# Patient Record
Sex: Female | Born: 1935 | Race: Black or African American | Hispanic: No | Marital: Single
Health system: Southern US, Community
[De-identification: ages and names within clinical notes are randomized; demographics above are authoritative.]

## PROBLEM LIST (undated history)

## (undated) DIAGNOSIS — F0390 Unspecified dementia without behavioral disturbance: Secondary | ICD-10-CM

## (undated) DIAGNOSIS — E119 Type 2 diabetes mellitus without complications: Secondary | ICD-10-CM

## (undated) DIAGNOSIS — I10 Essential (primary) hypertension: Secondary | ICD-10-CM

## (undated) DIAGNOSIS — K219 Gastro-esophageal reflux disease without esophagitis: Secondary | ICD-10-CM

## (undated) DIAGNOSIS — N189 Chronic kidney disease, unspecified: Secondary | ICD-10-CM

## (undated) DIAGNOSIS — M1990 Unspecified osteoarthritis, unspecified site: Secondary | ICD-10-CM

## (undated) DIAGNOSIS — R011 Cardiac murmur, unspecified: Secondary | ICD-10-CM

## (undated) HISTORY — PX: COLONOSCOPY: SHX174

## (undated) HISTORY — PX: TUBAL LIGATION: SHX77

---

## 2005-06-26 ENCOUNTER — Emergency Department: Payer: Self-pay | Admitting: Emergency Medicine

## 2007-04-05 ENCOUNTER — Emergency Department: Payer: Self-pay | Admitting: Internal Medicine

## 2007-04-05 ENCOUNTER — Other Ambulatory Visit: Payer: Self-pay

## 2007-09-30 ENCOUNTER — Emergency Department: Payer: Self-pay | Admitting: Emergency Medicine

## 2011-07-07 ENCOUNTER — Emergency Department: Payer: Self-pay | Admitting: *Deleted

## 2013-06-21 ENCOUNTER — Ambulatory Visit: Payer: Self-pay | Admitting: Family Medicine

## 2015-02-01 ENCOUNTER — Ambulatory Visit: Payer: Self-pay | Admitting: Gastroenterology

## 2015-02-07 ENCOUNTER — Emergency Department
Admit: 2015-02-07 | Disposition: A | Discharge status: Home / Self Care | Payer: Self-pay | Admitting: Emergency Medicine

## 2015-02-07 LAB — COMPREHENSIVE METABOLIC PANEL
ALBUMIN: 3.9 g/dL
ALK PHOS: 90 U/L
ANION GAP: 12 (ref 7–16)
BUN: 17 mg/dL
Bilirubin,Total: 0.7 mg/dL
CHLORIDE: 103 mmol/L
Calcium, Total: 8.9 mg/dL
Co2: 24 mmol/L
Creatinine: 1.76 mg/dL — ABNORMAL HIGH
EGFR (Non-African Amer.): 26 — ABNORMAL LOW
GFR CALC AF AMER: 30 — AB
Glucose: 128 mg/dL — ABNORMAL HIGH
Potassium: 3.5 mmol/L
SGOT(AST): 15 U/L
SGPT (ALT): 8 U/L — ABNORMAL LOW
Sodium: 139 mmol/L
TOTAL PROTEIN: 6.6 g/dL

## 2015-02-07 LAB — CBC WITH DIFFERENTIAL/PLATELET
Basophil #: 0 10*3/uL (ref 0.0–0.1)
Basophil %: 0.3 %
EOS ABS: 0 10*3/uL (ref 0.0–0.7)
Eosinophil %: 0.2 %
HCT: 38.7 % (ref 35.0–47.0)
HGB: 12.6 g/dL (ref 12.0–16.0)
Lymphocyte #: 1.6 10*3/uL (ref 1.0–3.6)
Lymphocyte %: 12.9 %
MCH: 26.9 pg (ref 26.0–34.0)
MCHC: 32.7 g/dL (ref 32.0–36.0)
MCV: 82 fL (ref 80–100)
MONO ABS: 0.7 x10 3/mm (ref 0.2–0.9)
Monocyte %: 5.7 %
NEUTROS PCT: 80.9 %
Neutrophil #: 10.1 10*3/uL — ABNORMAL HIGH (ref 1.4–6.5)
PLATELETS: 249 10*3/uL (ref 150–440)
RBC: 4.7 10*6/uL (ref 3.80–5.20)
RDW: 14.3 % (ref 11.5–14.5)
WBC: 12.4 10*3/uL — ABNORMAL HIGH (ref 3.6–11.0)

## 2015-02-07 LAB — LIPASE, BLOOD: Lipase: 20 U/L — ABNORMAL LOW

## 2015-02-07 LAB — URINALYSIS, COMPLETE
Bacteria: NONE SEEN
Bilirubin,UR: NEGATIVE
Blood: NEGATIVE
Glucose,UR: NEGATIVE mg/dL (ref 0–75)
Hyaline Cast: 6
LEUKOCYTE ESTERASE: NEGATIVE
Nitrite: NEGATIVE
PROTEIN: NEGATIVE
Ph: 5 (ref 4.5–8.0)
Specific Gravity: 1.012 (ref 1.003–1.030)
Squamous Epithelial: 1
WBC UR: <1 /HPF (ref 0–5)

## 2015-02-07 LAB — TROPONIN I: Troponin-I: <0.03 ng/mL

## 2015-02-28 LAB — COMPREHENSIVE METABOLIC PANEL
ALT: 8 U/L — AB
Albumin: 3.6 g/dL
Alkaline Phosphatase: 72 U/L
Anion Gap: 13 (ref 7–16)
BILIRUBIN TOTAL: 0.8 mg/dL
BUN: 34 mg/dL — AB
CHLORIDE: 102 mmol/L
CO2: 21 mmol/L — AB
CREATININE: 2.53 mg/dL — AB
Calcium, Total: 8.7 mg/dL — ABNORMAL LOW
EGFR (African American): 20 — ABNORMAL LOW
EGFR (Non-African Amer.): 17 — ABNORMAL LOW
Glucose: 172 mg/dL — ABNORMAL HIGH
POTASSIUM: 3.8 mmol/L
SGOT(AST): 19 U/L
Sodium: 136 mmol/L
TOTAL PROTEIN: 6.9 g/dL

## 2015-02-28 LAB — CBC WITH DIFFERENTIAL/PLATELET
Basophil #: 0.1 10*3/uL (ref 0.0–0.1)
Basophil %: 0.4 %
Eosinophil #: 0.1 10*3/uL (ref 0.0–0.7)
Eosinophil %: 0.5 %
HCT: 32.5 % — ABNORMAL LOW (ref 35.0–47.0)
HGB: 10.5 g/dL — ABNORMAL LOW (ref 12.0–16.0)
LYMPHS ABS: 1.9 10*3/uL (ref 1.0–3.6)
LYMPHS PCT: 10.5 %
MCH: 26.6 pg (ref 26.0–34.0)
MCHC: 32.1 g/dL (ref 32.0–36.0)
MCV: 83 fL (ref 80–100)
MONOS PCT: 8.3 %
Monocyte #: 1.5 x10 3/mm — ABNORMAL HIGH (ref 0.2–0.9)
NEUTROS PCT: 80.3 %
Neutrophil #: 14.7 10*3/uL — ABNORMAL HIGH (ref 1.4–6.5)
PLATELETS: 271 10*3/uL (ref 150–440)
RBC: 3.93 10*6/uL (ref 3.80–5.20)
RDW: 13.8 % (ref 11.5–14.5)
WBC: 18.3 10*3/uL — AB (ref 3.6–11.0)

## 2015-02-28 LAB — LIPASE, BLOOD: LIPASE: 23 U/L

## 2015-02-28 LAB — TROPONIN I: Troponin-I: <0.03 ng/mL

## 2015-03-01 ENCOUNTER — Inpatient Hospital Stay
Admit: 2015-03-01 | Disposition: A | Discharge status: Home / Self Care | Payer: Self-pay | Attending: Internal Medicine | Admitting: Internal Medicine

## 2015-03-01 LAB — URINALYSIS, COMPLETE
BLOOD: NEGATIVE
Bacteria: NONE SEEN
Bilirubin,UR: NEGATIVE
Glucose,UR: NEGATIVE mg/dL (ref 0–75)
KETONE: NEGATIVE
Leukocyte Esterase: NEGATIVE
Nitrite: NEGATIVE
Ph: 5 (ref 4.5–8.0)
Protein: NEGATIVE
SPECIFIC GRAVITY: 1.013 (ref 1.003–1.030)

## 2015-03-02 LAB — CBC WITH DIFFERENTIAL/PLATELET
BASOS PCT: 0.6 %
Basophil #: 0.1 10*3/uL (ref 0.0–0.1)
Eosinophil #: 0.1 10*3/uL (ref 0.0–0.7)
Eosinophil %: 1.1 %
HCT: 29.6 % — ABNORMAL LOW (ref 35.0–47.0)
HGB: 9.7 g/dL — AB (ref 12.0–16.0)
Lymphocyte #: 1.8 10*3/uL (ref 1.0–3.6)
Lymphocyte %: 18.2 %
MCH: 26.5 pg (ref 26.0–34.0)
MCHC: 32.8 g/dL (ref 32.0–36.0)
MCV: 81 fL (ref 80–100)
Monocyte #: 1 x10 3/mm — ABNORMAL HIGH (ref 0.2–0.9)
Monocyte %: 9.5 %
Neutrophil #: 7.1 10*3/uL — ABNORMAL HIGH (ref 1.4–6.5)
Neutrophil %: 70.6 %
Platelet: 279 10*3/uL (ref 150–440)
RBC: 3.66 10*6/uL — AB (ref 3.80–5.20)
RDW: 13.4 % (ref 11.5–14.5)
WBC: 10 10*3/uL (ref 3.6–11.0)

## 2015-03-02 LAB — BASIC METABOLIC PANEL
ANION GAP: 6 — AB (ref 7–16)
BUN: 22 mg/dL — ABNORMAL HIGH
Calcium, Total: 8.2 mg/dL — ABNORMAL LOW
Chloride: 111 mmol/L
Co2: 23 mmol/L
Creatinine: 1.78 mg/dL — ABNORMAL HIGH
EGFR (African American): 30 — ABNORMAL LOW
EGFR (Non-African Amer.): 26 — ABNORMAL LOW
Glucose: 94 mg/dL
POTASSIUM: 3.8 mmol/L
Sodium: 140 mmol/L

## 2015-03-02 LAB — HEMOGLOBIN A1C: Hemoglobin A1C: 6.3 % — ABNORMAL HIGH

## 2015-03-03 LAB — BASIC METABOLIC PANEL
Anion Gap: 8 (ref 7–16)
BUN: 15 mg/dL
CALCIUM: 8.2 mg/dL — AB
CREATININE: 1.68 mg/dL — AB
Chloride: 110 mmol/L
Co2: 22 mmol/L
EGFR (African American): 32 — ABNORMAL LOW
EGFR (Non-African Amer.): 28 — ABNORMAL LOW
Glucose: 114 mg/dL — ABNORMAL HIGH
POTASSIUM: 3.3 mmol/L — AB
Sodium: 140 mmol/L

## 2015-03-11 NOTE — H&P (Signed)
PATIENT NAME:  Sarah Mccormick, Sarah Mccormick MR#:  [id number] DATE OF BIRTH:  1928-10-10  DATE OF ADMISSION:  03/01/2015  REFERRING PHYSICIAN:  Marjean Donna, MD.   PRIMARY CARE PHYSICIAN: Tomasa Hose, MD.    ADMIT DIAGNOSIS:  Acute on chronic kidney failure.   HISTORY OF PRESENT ILLNESS: This is an 81 year old African-American female who presented to the Emergency Department via EMS after family noticed that she had been feeling excessively weak for approximately 3 days. The patient admits that she has had a decreased appetite, but denies nausea, vomiting, or diarrhea. She denies fevers, chest pain, or shortness of breath as well. Notably she had some flank pain on examination by the Emergency Department physician that was located more in the right flank and upper quadrant. Further questioning revealed that the pain is worse after she eats. The patient denies seeing any blood in her stool. Laboratory evaluation revealed that the patient had a steep decline in her renal function which prompted the Emergency Department to call for admission.   REVIEW OF SYSTEMS:   CONSTITUTIONAL:  The patient denies fevers, but admits to generalized weakness.  EYES: Denies blurred vision or inflammation.  HEENT: Denies tinnitus or sore throat.  RESPIRATORY: Denies cough or shortness of breath.  CARDIOVASCULAR: Denies chest pain, palpitations, orthopnea, or paroxysmal nocturnal dyspnea.  GASTROINTESTINAL: Denies nausea, vomiting, diarrhea, or abdominal pain. When asked, however it is easy to elicit abdominal pain from the patient.  GENITOURINARY: Denies dysuria, increased frequency, or hesitancy of urination.  ENDOCRINE: Denies polyuria, polydipsia.  HEMATOLOGIC AND LYMPHATIC: Denies easy bruising or bleeding.  INTEGUMENTARY: Denies rashes or lesions.  MUSCULOSKELETAL: Denies arthralgias or myalgias.  NEUROLOGIC: Denies numbness in her extremities or dysarthria.  PSYCHIATRIC: Denies depression or suicidal ideation.   PAST  MEDICAL HISTORY:,: Chronic kidney disease, hypertension, and diabetes type 2.   PAST SURGICAL HISTORY: C-section.    SOCIAL HISTORY: The patient reports that she lives in a group home (it is unclear if this is a nursing home). She denies drugs or alcohol use. She admits to dipping snuff.    FAMILY HISTORY: The patient cannot provide any specific ailments that seem to run throughout the family.   MEDICATIONS:  1. Altace 10 mg 1 capsule p.o. b.i.d.  2. Hydrochlorothiazide 25 mg 1 tablet p.o. daily.  3. Lovastatin 40 mg 2 tablets p.o. daily.  4. Metformin 500 mg 1 tablet p.o. b.i.d.  5. Omeprazole 20 mg delayed release capsule 1 capsule p.o. daily.  6. Robitussin D 5 mL every 4 hours as needed for cough while awake.    ALLERGIES: No known drug allergies.   PERTINENT LABORATORY RESULTS AND RADIOGRAPHIC FINDINGS: Serum glucose is 172, BUN 34, creatinine 2.53, serum sodium 136, potassium is 3.8, chloride is 102, bicarbonate 21, calcium is 8.7.  Lipase is 23. Serum albumin is 3.6. Alkaline phosphatase 72, AST is 19, ALT is 8. Troponin is negative. White blood cell count is 18.3, hemoglobin 10.5, hematocrit 32.5, platelet count of 271,000, MCV is 83. Urine is negative for ketones, leukocyte esterase, and nitrites. CT of the abdomen and pelvis without contrast shows that there is right lower lobe airspace opacity that may reflect atelectasis or pneumonia, there is also a trace right pleural effusion, there is a nonobstructing stone within each kidney, there is no hydronephrosis. There is a mildly prominent appendix without stranding or evidence of inflammation. There is some interval resolution of thickened bowel loops within the right upper quadrant .    PHYSICAL EXAMINATION:  VITAL  SIGNS: Temperature is 97.9, pulse 106, respirations 20, blood pressure is 165/79, pulse oximetry is 93% on room air.  GENERAL: The patient is alert and oriented x 2. She is mildly confused about circumstances preceding her  arrival to the Emergency Department. She is in no apparent distress.  HEENT: Normocephalic, atraumatic. Pupils equal, round, and react to light and accommodation. Extraocular movements are intact. Mucous membranes are moist.  NECK: Trachea is midline. No adenopathy. Thyroid is not nonpalpable, nontender. CHEST: Symmetric and atraumatic.  CARDIOVASCULAR: Regular rate and rhythm. Normal S1, S2. No rubs, clicks, or murmurs appreciated.  LUNGS: Clear to auscultation bilaterally. Normal effort and excursion.  ABDOMEN: Positive bowel sounds. Soft, nontender, nondistended. No hepatosplenomegaly.  GENITOURINARY: Deferred.  MUSCULOSKELETAL: The patient moves all 4 extremities equally. She has 5 out of 5 strength in her upper extremities and puts forth a poor effort in cooperating with the lower extremity strength exam, however she is able to walk according to family members and has not had any difficulty walking prior to this admission although I have not tested her gait.  EXTREMITIES: No clubbing, cyanosis, or edema.  NEUROLOGIC: Cranial nerves II through XII are grossly intact.  PSYCHIATRIC: Mood is normal. Affect is congruent. The patient has fair insight and judgment into her medical condition.   ASSESSMENT AND PLAN: This is an 81 year old female admitted for acute on chronic kidney failure, as well as sepsis.   1.  Acute on chronic kidney disease. The patient now is in stage IV chronic kidney disease.  I have started intravenous fluid and we will try to avoid nephrotoxic drugs.  2.  Sepsis. The patient meets criteria via tachycardia, tachypnea, and leukocytosis. She has received a dose of Levaquin in the Emergency Department presumably for pneumonia even though the patient currently denies cough. We will continue her antibiotic coverage for healthcare-associated pneumonia at the patient apparently lives in a group setting. 3.  Hypertension. Continue Altace, although I have reduced the dose given her  kidney failure.  4.  Diabetes type 2. I will hold the patient's metformin for now.  I have added sliding scale insulin while she is in the hospital. 5.  Deep vein thrombosis prophylaxis. Heparin.  6.  Gastrointestinal prophylaxis. None as the patient is not currently critically ill.   CODE STATUS: The patient is a full code.   TIME SPENT ON ADMISSION ORDERS AND PATIENT CARE:  Approximately 40 minutes.     ____________________________ Norva Riffle. Marcille Blanco, MD msd:bu D: 03/01/2015 15:15:10 ET T: 03/01/2015 15:41:44 ET JOB#: [id number]  cc: Norva Riffle. Marcille Blanco, MD, <Dictator> Norva Riffle Analayah Brooke MD ELECTRONICALLY SIGNED 03/04/2015 3:32

## 2015-03-11 NOTE — Consult Note (Signed)
PATIENT NAME:  Sarah Mccormick, Sarah Mccormick MR#:  [id number] DATE OF BIRTH:  18-Oct-1928  DATE OF CONSULTATION:  03/01/2015  REFERRING PHYSICIAN:   CONSULTING PHYSICIAN:  Jerrol Banana. Burt Knack, MD  CHIEF COMPLAINT: Abdominal pain.   HISTORY OF PRESENT ILLNESS: This is a patient with abdominal pain for anywhere from 2 days to longer than 3 weeks depending on who gives the version of the history. The patient is a very poor historian, lives in assisted living facility, is accompanied by her son and a daughter apparently was present earlier who gave Dr. Owens Shark a different story. The patient is difficult historian as she does not volunteer information.   History from the patient and from the son is that this has likely been going on for longer than 3 weeks. In fact, 3 weeks ago, she was here with very similar symptoms, if not identical symptoms, possibly with diarrhea at that time with nausea and vomiting and lower abdominal pain and an elevated white blood cell count and a CT suggestive of enteritis. She has a very similar picture today; see below.  Bowel history cannot be obtained. The patient does not answer the question and the son does not know if she is having diarrhea. She has been nauseated, has not vomited this episode. Denies fevers or chills. Unclear if she has had any bowel movements and cannot answer questions concerning melena or hematochezia. She does have some back pain along with this, which she also had 3 weeks ago according to the son.   Unclear if there has been weight loss.   PAST SURGICAL HISTORY: Cesarean section.   PAST MEDICAL HISTORY: The patient denies any medical history, denies myocardial infarction, stroke or known kidney disease. Has never been told that her creatinine has been elevated (see below).   ALLERGIES: None.   MEDICATIONS: None listed. The patient does not answer any questions. Apparently, she has been taken some Cipro since her last visit.   FAMILY HISTORY: Not  obtainable.  REVIEW OF SYSTEMS: Not obtainable except that mentioned in the HPI.  PHYSICAL EXAMINATION: GENERAL: Healthy, comfortable-appearing female patient.  VITAL SIGNS: Temperature of 98.3, pulse of 90, respirations 20, blood pressure 123/60, room air 94% saturation. Pain scale was 10. BMI of 31.  HEENT: Shows no scleral icterus.  NECK: No palpable neck nodes.  CHEST: Clear to auscultation.  CARDIAC: Regular rate and rhythm.  ABDOMEN: Soft, nondistended, nontympanitic. Minimally tender in the lower quadrants bilaterally without peritoneal signs. No percussion, rebound, or guarding.  EXTREMITIES: Show moderate edema.  NEUROLOGIC: Grossly intact.  INTEGUMENT: Shows no jaundice.   LABORATORY, DIAGNOSTIC AND RADIOLOGICAL DATA: CT scan from 03/30 and from 02/28/2015 are personally reviewed. No sign of appendicitis. There is some thickening of the small bowel walls, more prominent on 03/30 than 04/20 and extensive calcifications in the vessels of the abdomen and retroperitoneum,   White blood cell count is 18.3, with an H and H of 10 and 32, and the white blood cell count was elevated at 11 back on 03/30 but she had a normal hemogram.   Electrolytes are within normal limits, but her creatinine and BUN are elevated with a creatinine of 2.5 and a BUN of 34. CO2 is slightly depressed at 21. Creatinine 1.8 on prior admission.   ASSESSMENT AND PLAN: I was asked to see the patient to rule out appendicitis because her appendix was dilated at 7 mm, but there was no sign of stranding around the appendix and in reviewing the patient's chart  and her history with she and her family, it is clear that she has been having symptoms much longer than 2 or 3 days as Dr. Owens Shark was led to believe. She was here 3 weeks ago with nearly identical symptoms and a workup, which was nearly identical as well with elevated white blood cell count and an abnormal CT scan suggestive of possible enteritis. Unclear if she is  having diarrhea at this time. She is a difficult historian as above. With that in mind, I discussed with Dr. Owens Shark the need for follow up. Her creatinine is elevated and considerably different than it was 3 weeks ago. She needs to be admitted to the hospital and hydrated, but I suggested a medical admission and Dr. Marina Gravel and I would be happy to follow the patient while in the hospital, but I doubt that she will require surgery. Family understood this plan as well.    ____________________________ Jerrol Banana. Burt Knack, MD rec:TM D: 03/01/2015 01:25:47 ET T: 03/01/2015 01:51:17 ET JOB#: [id number]  cc: Jerrol Banana. Burt Knack, MD, <Dictator> Florene Glen MD ELECTRONICALLY SIGNED 03/01/2015 7:00

## 2015-03-11 NOTE — Consult Note (Signed)
Brief Consult Note: Diagnosis: abd pain.   Patient was seen by consultant.   Consult note dictated.   Recommend further assessment or treatment.   Discussed with Attending MD.   Comments: No sign of acute appendicitis. Similar if not identical episode and w/u three weeks ago, and symptoms possibly longer than that. Suspect transient ischemia that is consistant with vasc disease of kidneys, coronaries, etc. Discussed with Dr Owens Shark. Will follow with PD while in hospital.  Electronic Signatures: Florene Glen (MD)  (Signed 21-Apr-16 01:18)  Authored: Brief Consult Note   Last Updated: 21-Apr-16 01:18 by Florene Glen (MD)

## 2015-03-11 NOTE — Consult Note (Signed)
PATIENT NAME:  Sarah Mccormick, Sarah Mccormick MR#:  [id number] DATE OF BIRTH:  09-Dec-1927  DATE OF CONSULTATION:  03/02/2015.  CONSULTING PHYSICIAN:  Manya Silvas, MD.  REASON FOR CONSULTATION:  Patient is an 81 year old black female who was admitted to the hospital for renal insufficiency, worsening creatinine, in association with abdominal pain.  I was asked to see her for the abdominal pain.   HISTORY OF PRESENT ILLNESS:  The abdominal pain has been present for several weeks.  It has improved some since she received antibiotics.  She had a CAT scan a few weeks ago showing the presence of thickening of the small bowel in the clinical setting of nausea and vomiting and diarrhea, and it was suggestive of enteritis.  She had a repeat CAT scan done with this admission and it did not show enteritis.   PAST SURGICAL HISTORY:  C-section and hysterectomy.   PAST MEDICAL HISTORY:  No history of heart attacks, strokes, but she has been diabetic for 30 years and has hypertension   ALLERGIES:  No known drug allergies.   The patient is a difficult historian and 90% of the information is obtained from the patient's  daughter who happens to be in the room.   FAMILY HISTORY:  Positive for mother with colon cancer and a sibling with colon cancer. There are actually 24 children in the family.  She has not had a colonoscopy in many years   The daughter said that she is going to have her mother come and live with her and that she was concerned about her abdominal pain and would like possible endoscopic and colonoscopic investigations for the pain and for the history of colon cancer in the family.   The patient's creatinine has gone up significantly in 3-week period of time and BUN has gone up, indicative of likely inadequate fluid intake.  Also, her white count is up to 18.3.  CAT scan of the abdomen and pelvis showed some right lower airspace opacity that may reflect atelectasis or pneumonia.  There was a nonobstructing  stone in each kidney, some interval resolution of the thickened bowel loops seen previously.   MEDICATIONS:  Altace 10 mg 1 capsule b.i.d., hydrochlorothiazide 25 mg 1 tablet daily, lovastatin 40 mg 2 tablets daily, metformin 500 mg 1 tablet b.i.d., omeprazole 20 mg 1 a day, Robitussin p.r.n.   SOCIAL HISTORY:  The patient does not smoke, does not drink.  She does dip snuff.    REVIEW OF SYSTEMS:   MUSCULOSKELETAL:  There is some generalized weakness.  There is some back discomfort from lying in bed a lot.   CARDIOPULMONARY:  She denies any chest pain or skipping irregular heartbeats or shortness of breath.   GASTROINTESTINAL:  No nausea, vomiting, or diarrhea.   GENITOURINARY:  No dysuria.   PHYSICAL EXAMINATION:  GENERAL:  Black female in no acute distress.  VITAL SIGNS:  Temperature 97.7, pulse 92, respirations 18, blood pressure 166/83, O2  saturation 95% on room air.  HEENT:  Sclerae nonicteric.  Conjunctivae negative.  Tongue is negative.  Head is atraumatic. NECK:  No carotid bruits in the neck.  CHEST:  A few crackles at both bases posteriorly.  HEART:  Short 1/6 systolic murmur.  ABDOMEN:  Bowel sounds present.  No hepatosplenomegaly.  No masses.  No bruits.  No significant tenderness on palpation.  EXTREMITIES:  She has compression devices on her legs.   LABORATORY DATA:  Since admission in the evening 2 days ago, her creatinine  has gone from 2.5 to 1.78, BUN from 34 to 22.  Her potassium now is 3.8, bicarbonate 23.  A liver panel 2 days ago was normal, except for SGPT of 8.  White count was 18.3 two days ago and now 10.  Hemoglobin is 9.7, platelet count 279,000.  Urinalysis essentially negative.    Since admission, she has been started on Levaquin 250 mg IV every 48 hours for possible pneumonia.   ASSESSMENT:   1. Possible pneumonia given infiltrate in the right lower lobe on CAT scan.  This could be atelectasis.  The patient did have elevated white count but this is also in  the setting of volume depletion.  I think it would be a good idea to go ahead and get a good PA and lateral of the chest to serve as a baseline and will order this for in the morning.  2. Patient had abdominal discomfort and CAT scan showing enteritis.  It has improved on the CAT scan this admission compared to previous admission.  The daughter would like, if possible, the patient to have an upper endoscopy and a colonoscopy while here.  I explained to her that if she has early pneumonia, that it may not be a good idea to do these procedures at this time.  We will see how she does over the next few days and consider doing the upper for abdominal pain and the colonoscopy for abdominal pain and family history of colon cancer.    I will follow with you.    ____________________________ Manya Silvas, MD rte:kc D: 03/02/2015 19:11:18 ET T: 03/02/2015 19:43:59 ET JOB#: [id number]  cc: Manya Silvas, MD, <Dictator> Manya Silvas MD ELECTRONICALLY SIGNED 03/06/2015 7:55

## 2015-03-11 NOTE — Discharge Summary (Signed)
PATIENT NAME:  Sarah Mccormick, SANGIOVANNI MR#:  [id number] DATE OF BIRTH:  06/06/1928  DATE OF ADMISSION:  03/01/2015 DATE OF DISCHARGE:  03/03/2015  PRIMARY CARE PHYSICIAN: Ngwe A. Clide Deutscher, MD   DISCHARGE DIAGNOSES:  1.  Acute renal failure on chronic kidney disease, stage IV.  2.  Sepsis with healthcare-associated pneumonia.  3.  Abdominal pain.  4.  Hypertension.  5.  Diabetes.   CONDITION: Stable.   CODE STATUS: Full code.   HOME MEDICATIONS: Please refer to the medication reconciliation list.   DIET: Low-sodium, low-fat, low-cholesterol, ADA diet.   ACTIVITY: As tolerated.   FOLLOWUP CARE: With PCP within 1-2 weeks. Follow up with Dr. Vira Agar for possible endoscopy within 1-2 weeks.   REASON FOR ADMISSION: Acute on chronic weakness for 3 days and poor oral intake.   HOSPITAL COURSE: The patient is an 81 year old African American female with history of CKD, hypertension, and diabetes, was sent from assisted living to ED due to generalized weakness and poor oral intake. For detailed history and physical examination, please refer to the admission note dictated by Dr. Marcille Blanco. Laboratory data on admission date showed BUN 34, creatinine 2.53. Electrolytes were normal. CT of abdomen and pelvis shows right lower lobe airspace opacity that may reflect atelectasis or pneumonia with trace right pleural effusions.  1.  Acute renal failure on CKD. After admission, the patient's ramipril and metformin were on hold due to renal failure. In addition, the patient has been treated with IV fluid support. BUN and creatinine have been improving. Today's BUN is a 15, creatinine is 1.86.  2.  Sepsis with healthcare-associated pneumonia. The patient was initially treated with vancomycin and Zosyn, changed to p.o. Levaquin. The patient only has a cough with sputum but no shortness of breath. The patient's cough is better.  3.  Hypertension. The patient HCTZ and ramipril were on hold due to renal failure. I started  Lopressor. The patient's blood pressure has been controlled. The patient will be discharged home with Altace and Lopressor, but we will discontinue HCTZ due to dehydration and hypokalemia.  4.  Hypokalemia. The patient was treated with potassium supplement.  5.  Diabetes. The patient is on sliding scale. We will resume metformin, since the patient's renal function is better.  6.  For abdominal pain. Dr. Marina Gravel evaluated the patient and suggested no indication for any surgery. The patient was scheduled to have EGD as outpatient. The patient's daughter requested a GI consult. Dr. Vira Agar suggested since the patient had pneumonia at this time, the patient needs to follow up with him for endoscopy as outpatient.   DISPOSITION: The patient's vital signs are stable.  She is clinically stable and will be discharged to assisted living today.   I discussed the patient's discharge plan with the patient, nurse, social worker, and Dr. Vira Agar.   TIME SPENT: About 66.    ____________________________ Demetrios Loll, MD qc:bm D: 03/03/2015 15:29:23 ET T: 03/04/2015 01:40:23 ET JOB#: [id number]  cc: Demetrios Loll, MD, <Dictator> Demetrios Loll MD ELECTRONICALLY SIGNED 03/04/2015 14:27

## 2015-03-30 ENCOUNTER — Ambulatory Visit: Admission: RE | Admit: 2015-03-30 | Payer: Medicare Other | Source: Ambulatory Visit | Admitting: Gastroenterology

## 2015-03-30 ENCOUNTER — Encounter: Admission: RE | Payer: Self-pay | Source: Ambulatory Visit

## 2015-03-30 SURGERY — EGD (ESOPHAGOGASTRODUODENOSCOPY)
Anesthesia: General

## 2015-06-08 ENCOUNTER — Encounter: Payer: Self-pay | Admitting: *Deleted

## 2015-06-11 ENCOUNTER — Ambulatory Visit: Payer: Medicare Other | Admitting: Anesthesiology

## 2015-06-11 ENCOUNTER — Encounter
Admission: RE | Disposition: A | Discharge status: Home / Self Care | Payer: Self-pay | Source: Ambulatory Visit | Attending: Unknown Physician Specialty

## 2015-06-11 ENCOUNTER — Ambulatory Visit
Admission: RE | Admit: 2015-06-11 | Discharge: 2015-06-11 | Disposition: A | Discharge status: Home / Self Care | Payer: Medicare Other | Source: Ambulatory Visit | Attending: Unknown Physician Specialty | Admitting: Unknown Physician Specialty

## 2015-06-11 DIAGNOSIS — M1990 Unspecified osteoarthritis, unspecified site: Secondary | ICD-10-CM | POA: Diagnosis not present

## 2015-06-11 DIAGNOSIS — N189 Chronic kidney disease, unspecified: Secondary | ICD-10-CM | POA: Insufficient documentation

## 2015-06-11 DIAGNOSIS — Z7982 Long term (current) use of aspirin: Secondary | ICD-10-CM | POA: Diagnosis not present

## 2015-06-11 DIAGNOSIS — D122 Benign neoplasm of ascending colon: Secondary | ICD-10-CM | POA: Insufficient documentation

## 2015-06-11 DIAGNOSIS — K640 First degree hemorrhoids: Secondary | ICD-10-CM | POA: Diagnosis not present

## 2015-06-11 DIAGNOSIS — K219 Gastro-esophageal reflux disease without esophagitis: Secondary | ICD-10-CM | POA: Diagnosis not present

## 2015-06-11 DIAGNOSIS — Z79899 Other long term (current) drug therapy: Secondary | ICD-10-CM | POA: Insufficient documentation

## 2015-06-11 DIAGNOSIS — K5730 Diverticulosis of large intestine without perforation or abscess without bleeding: Secondary | ICD-10-CM | POA: Diagnosis not present

## 2015-06-11 DIAGNOSIS — R195 Other fecal abnormalities: Secondary | ICD-10-CM | POA: Diagnosis present

## 2015-06-11 DIAGNOSIS — F0390 Unspecified dementia without behavioral disturbance: Secondary | ICD-10-CM | POA: Diagnosis not present

## 2015-06-11 DIAGNOSIS — E119 Type 2 diabetes mellitus without complications: Secondary | ICD-10-CM | POA: Diagnosis not present

## 2015-06-11 DIAGNOSIS — R011 Cardiac murmur, unspecified: Secondary | ICD-10-CM | POA: Insufficient documentation

## 2015-06-11 DIAGNOSIS — I129 Hypertensive chronic kidney disease with stage 1 through stage 4 chronic kidney disease, or unspecified chronic kidney disease: Secondary | ICD-10-CM | POA: Insufficient documentation

## 2015-06-11 HISTORY — DX: Type 2 diabetes mellitus without complications: E11.9

## 2015-06-11 HISTORY — PX: COLONOSCOPY WITH PROPOFOL: SHX5780

## 2015-06-11 HISTORY — PX: ESOPHAGOGASTRODUODENOSCOPY (EGD) WITH PROPOFOL: SHX5813

## 2015-06-11 HISTORY — DX: Cardiac murmur, unspecified: R01.1

## 2015-06-11 HISTORY — DX: Gastro-esophageal reflux disease without esophagitis: K21.9

## 2015-06-11 HISTORY — DX: Unspecified dementia, unspecified severity, without behavioral disturbance, psychotic disturbance, mood disturbance, and anxiety: F03.90

## 2015-06-11 HISTORY — DX: Chronic kidney disease, unspecified: N18.9

## 2015-06-11 HISTORY — DX: Essential (primary) hypertension: I10

## 2015-06-11 HISTORY — DX: Unspecified osteoarthritis, unspecified site: M19.90

## 2015-06-11 LAB — GLUCOSE, CAPILLARY: GLUCOSE-CAPILLARY: 97 mg/dL (ref 65–99)

## 2015-06-11 SURGERY — COLONOSCOPY WITH PROPOFOL
Anesthesia: General

## 2015-06-11 MED ORDER — FENTANYL CITRATE (PF) 100 MCG/2ML IJ SOLN
INTRAMUSCULAR | Status: DC | PRN
  Administered 2015-06-11: 50 ug via INTRAVENOUS

## 2015-06-11 MED ORDER — MIDAZOLAM HCL 2 MG/2ML IJ SOLN
INTRAMUSCULAR | Status: DC | PRN
  Administered 2015-06-11: 1 mg via INTRAVENOUS

## 2015-06-11 MED ORDER — LIDOCAINE HCL (CARDIAC) 20 MG/ML IV SOLN
INTRAVENOUS | Status: DC | PRN
  Administered 2015-06-11: 40 mg via INTRAVENOUS

## 2015-06-11 MED ORDER — SODIUM CHLORIDE 0.9 % IV SOLN
INTRAVENOUS | Status: DC
  Administered 2015-06-11: 12:00:00 via INTRAVENOUS

## 2015-06-11 MED ORDER — PROPOFOL INFUSION 10 MG/ML OPTIME
INTRAVENOUS | Status: DC | PRN
  Administered 2015-06-11: 120 ug/kg/min via INTRAVENOUS

## 2015-06-11 MED ORDER — LIDOCAINE HCL (PF) 1 % IJ SOLN
INTRAMUSCULAR | Status: AC
  Filled 2015-06-11: qty 2

## 2015-06-11 MED ORDER — GLYCOPYRROLATE 0.2 MG/ML IJ SOLN
INTRAMUSCULAR | Status: DC | PRN
  Administered 2015-06-11: 0.1 mg via INTRAVENOUS

## 2015-06-11 NOTE — Transfer of Care (Signed)
Immediate Anesthesia Transfer of Care Note  Patient: Sarah Mccormick  Procedure(s) Performed: Procedure(s): COLONOSCOPY WITH PROPOFOL (N/A) ESOPHAGOGASTRODUODENOSCOPY (EGD) WITH PROPOFOL (N/A)  Patient Location: PACU  Anesthesia Type:General  Level of Consciousness: awake and sedated  Airway & Oxygen Therapy: Patient Spontanous Breathing and Patient connected to nasal cannula oxygen  Post-op Assessment: Report given to RN and Post -op Vital signs reviewed and stable  Post vital signs: Reviewed and stable  Last Vitals:  Filed Vitals:   06/11/15 1056  BP: 142/39  Pulse: 44  Temp: 36.6 C  Resp: 17    Complications: No apparent anesthesia complications

## 2015-06-11 NOTE — H&P (Signed)
Primary Care Physician:  Donnie Coffin, MD Primary Gastroenterologist:  Dr. Vira Agar  Pre-Procedure History & Physical: HPI:  Sarah Mccormick is a 81 y.o. female is here for an endoscopy and colonoscopy.   Past Medical History  Diagnosis Date  . Murmur, cardiac   . Hypertension   . GERD (gastroesophageal reflux disease)   . Diabetes mellitus without complication   . Chronic kidney disease   . Arthritis   . Dementia     Past Surgical History  Procedure Laterality Date  . Tubal ligation    . Colonoscopy      Prior to Admission medications   Medication Sig Start Date End Date Taking? Authorizing Provider  acetaminophen (TYLENOL) 650 MG CR tablet Take 650 mg by mouth every 8 (eight) hours as needed for pain.   Yes Historical Provider, MD  aspirin EC 81 MG tablet Take 81 mg by mouth daily.   Yes Historical Provider, MD  guaiFENesin (ROBITUSSIN) 100 MG/5ML liquid Take 200 mg by mouth 3 (three) times daily as needed for cough.   Yes Historical Provider, MD  lovastatin (MEVACOR) 40 MG tablet Take 40 mg by mouth at bedtime.   Yes Historical Provider, MD  metFORMIN (GLUCOPHAGE) 500 MG tablet Take by mouth 2 (two) times daily with a meal.   Yes Historical Provider, MD  metoprolol succinate (TOPROL-XL) 25 MG 24 hr tablet Take 25 mg by mouth daily.   Yes Historical Provider, MD  metoprolol tartrate (LOPRESSOR) 25 MG tablet Take 25 mg by mouth 2 (two) times daily.   Yes Historical Provider, MD  omeprazole (PRILOSEC) 20 MG capsule Take 20 mg by mouth daily.   Yes Historical Provider, MD  traZODone (DESYREL) 100 MG tablet Take 100 mg by mouth at bedtime.   Yes Historical Provider, MD  levofloxacin (LEVAQUIN) 250 MG tablet Take 250 mg by mouth daily.    Historical Provider, MD  ramipril (ALTACE) 10 MG capsule Take 10 mg by mouth daily.    Historical Provider, MD    Allergies as of 05/17/2015  . (Not on File)    No family history on file.  History   Social History  . Marital Status:  Single    Spouse Name: N/A  . Number of Children: N/A  . Years of Education: N/A   Occupational History  . Not on file.   Social History Main Topics  . Smoking status: Not on file  . Smokeless tobacco: Not on file  . Alcohol Use: Not on file  . Drug Use: Not on file  . Sexual Activity: Not on file   Other Topics Concern  . Not on file   Social History Narrative  . No narrative on file    Review of Systems: See HPI, otherwise negative ROS  Physical Exam: BP 142/39 mmHg  Pulse 44  Temp(Src) 97.9 F (36.6 C) (Tympanic)  Resp 17  Ht 5\' 1"  (1.549 m)  Wt 82.101 kg (181 lb)  BMI 34.22 kg/m2  SpO2 100% General:   Alert,  pleasant and cooperative in NAD Head:  Normocephalic and atraumatic. Neck:  Supple; no masses or thyromegaly. Lungs:  Clear throughout to auscultation.    Heart:  Regular rate and rhythm. Abdomen:  Soft, nontender and nondistended. Normal bowel sounds, without guarding, and without rebound.   Neurologic:  Alert and  oriented x4;  grossly normal neurologically.  Impression/Plan: Sarah Mccormick is here for an endoscopy and colonoscopy to be performed for heme positive stool  Risks, benefits, limitations, and alternatives regarding  endoscopy and colonoscopy have been reviewed with the patient.  Questions have been answered.  All parties agreeable.   Gaylyn Cheers, MD  06/11/2015, 11:27 AM   Primary Care Physician:  Donnie Coffin, MD Primary Gastroenterologist:  Dr. Vira Agar  Pre-Procedure History & Physical: HPI:  Sarah Mccormick is a 81 y.o. female is here for an endoscopy and colonoscopy.   Past Medical History  Diagnosis Date  . Murmur, cardiac   . Hypertension   . GERD (gastroesophageal reflux disease)   . Diabetes mellitus without complication   . Chronic kidney disease   . Arthritis   . Dementia     Past Surgical History  Procedure Laterality Date  . Tubal ligation    . Colonoscopy      Prior to Admission medications    Medication Sig Start Date End Date Taking? Authorizing Provider  acetaminophen (TYLENOL) 650 MG CR tablet Take 650 mg by mouth every 8 (eight) hours as needed for pain.   Yes Historical Provider, MD  aspirin EC 81 MG tablet Take 81 mg by mouth daily.   Yes Historical Provider, MD  guaiFENesin (ROBITUSSIN) 100 MG/5ML liquid Take 200 mg by mouth 3 (three) times daily as needed for cough.   Yes Historical Provider, MD  lovastatin (MEVACOR) 40 MG tablet Take 40 mg by mouth at bedtime.   Yes Historical Provider, MD  metFORMIN (GLUCOPHAGE) 500 MG tablet Take by mouth 2 (two) times daily with a meal.   Yes Historical Provider, MD  metoprolol succinate (TOPROL-XL) 25 MG 24 hr tablet Take 25 mg by mouth daily.   Yes Historical Provider, MD  metoprolol tartrate (LOPRESSOR) 25 MG tablet Take 25 mg by mouth 2 (two) times daily.   Yes Historical Provider, MD  omeprazole (PRILOSEC) 20 MG capsule Take 20 mg by mouth daily.   Yes Historical Provider, MD  traZODone (DESYREL) 100 MG tablet Take 100 mg by mouth at bedtime.   Yes Historical Provider, MD  levofloxacin (LEVAQUIN) 250 MG tablet Take 250 mg by mouth daily.    Historical Provider, MD  ramipril (ALTACE) 10 MG capsule Take 10 mg by mouth daily.    Historical Provider, MD    Allergies as of 05/17/2015  . (Not on File)    No family history on file.  History   Social History  . Marital Status: Single    Spouse Name: N/A  . Number of Children: N/A  . Years of Education: N/A   Occupational History  . Not on file.   Social History Main Topics  . Smoking status: Not on file  . Smokeless tobacco: Not on file  . Alcohol Use: Not on file  . Drug Use: Not on file  . Sexual Activity: Not on file   Other Topics Concern  . Not on file   Social History Narrative  . No narrative on file    Review of Systems: See HPI, otherwise negative ROS  Physical Exam: BP 142/39 mmHg  Pulse 44  Temp(Src) 97.9 F (36.6 C) (Tympanic)  Resp 17  Ht 5\' 1"   (1.549 m)  Wt 82.101 kg (181 lb)  BMI 34.22 kg/m2  SpO2 100% General:   Alert,  pleasant and cooperative in NAD Head:  Normocephalic and atraumatic. Neck:  Supple; no masses or thyromegaly. Lungs:  Clear throughout to auscultation.    Heart:  Regular rate and rhythm. Abdomen:  Soft, nontender and nondistended. Normal bowel sounds, without guarding,  and without rebound.   Neurologic:  Alert and  oriented x4;  grossly normal neurologically.  Impression/Plan: Sarah Mccormick is here for an endoscopy and colonoscopy to be performed for heme positive stool  Risks, benefits, limitations, and alternatives regarding  endoscopy and colonoscopy have been reviewed with the patient.  Questions have been answered.  All parties agreeable.   Gaylyn Cheers, MD  06/11/2015, 11:27 AM

## 2015-06-11 NOTE — Anesthesia Postprocedure Evaluation (Signed)
  Anesthesia Post-op Note  Patient: Sarah Mccormick  Procedure(s) Performed: Procedure(s): COLONOSCOPY WITH PROPOFOL (N/A) ESOPHAGOGASTRODUODENOSCOPY (EGD) WITH PROPOFOL (N/A)  Anesthesia type:General  Patient location: PACU  Post pain: Pain level controlled  Post assessment: Post-op Vital signs reviewed, Patient's Cardiovascular Status Stable, Respiratory Function Stable, Patent Airway and No signs of Nausea or vomiting  Post vital signs: Reviewed and stable  Last Vitals:  Filed Vitals:   06/11/15 1225  BP: 135/48  Pulse: 63  Temp:   Resp: 14    Level of consciousness: awake, alert  and patient cooperative  Complications: No apparent anesthesia complications

## 2015-06-11 NOTE — Op Note (Signed)
[facility] Gastroenterology Patient Name: Sarah Mccormick Procedure Date: 06/11/2015 11:11 AM MRN: [id number] Account #: [id number] Date of Birth: [birth year] Admit Type: Outpatient Age: 81 Room: [facility] ENDO ROOM 1 Gender: Female Note Status: Finalized Procedure:         Colonoscopy Indications:       Heme positive stool Providers:         Manya Silvas, MD Medicines:         Propofol per Anesthesia Complications:     No immediate complications. Procedure:         Pre-Anesthesia Assessment:                    - After reviewing the risks and benefits, the patient was                     deemed in satisfactory condition to undergo the procedure.                    After obtaining informed consent, the colonoscope was                     passed under direct vision. Throughout the procedure, the                     patient's blood pressure, pulse, and oxygen saturations                     were monitored continuously. The Colonoscope was                     introduced through the anus and advanced to the the cecum,                     identified by appendiceal orifice and ileocecal valve. The                     colonoscopy was performed without difficulty. The patient                     tolerated the procedure well. The quality of the bowel                     preparation was good. Findings:      A diminutive polyp was found in the proximal ascending colon. The polyp       was sessile. The polyp was removed with a jumbo cold forceps. Resection       and retrieval were complete.      A few small-mouthed diverticula were found in the sigmoid colon.      Internal hemorrhoids were found during endoscopy. The hemorrhoids were       medium-sized and Grade I (internal hemorrhoids that do not prolapse).      The exam was otherwise without abnormality. Impression:        - One diminutive polyp in the proximal ascending colon.                     Resected and retrieved.                - Diverticulosis in the sigmoid colon.                    - Internal hemorrhoids.                    -  The examination was otherwise normal. Recommendation:    - The findings and recommendations were discussed with the                     patient's family. Manya Silvas, MD 06/11/2015 12:02:17 PM This report has been signed electronically. Number of Addenda: 0 Note Initiated On: 06/11/2015 11:11 AM Scope Withdrawal Time: 0 hours 7 minutes 2 seconds  Total Procedure Duration: 0 hours 9 minutes 48 seconds       [facility]

## 2015-06-11 NOTE — Anesthesia Preprocedure Evaluation (Signed)
Anesthesia Evaluation  Patient identified by MRN, date of birth, ID band Patient awake    Reviewed: Allergy & Precautions, NPO status , Patient's Chart, lab work & pertinent test results  History of Anesthesia Complications Negative for: history of anesthetic complications  Airway Mallampati: III  TM Distance: >3 FB Neck ROM: Full    Dental  (+) Poor Dentition   Pulmonary          Cardiovascular hypertension, Pt. on medications and Pt. on home beta blockers + Valvular Problems/Murmurs (no tx)     Neuro/Psych    GI/Hepatic GERD-  Medicated and Controlled,  Endo/Other  diabetes, Type 2, Oral Hypoglycemic Agents  Renal/GU Renal InsufficiencyRenal disease     Musculoskeletal   Abdominal   Peds  Hematology   Anesthesia Other Findings   Reproductive/Obstetrics                             Anesthesia Physical Anesthesia Plan  ASA: III  Anesthesia Plan: General   Post-op Pain Management:    Induction: Intravenous  Airway Management Planned: Nasal Cannula  Additional Equipment:   Intra-op Plan:   Post-operative Plan:   Informed Consent: I have reviewed the patients History and Physical, chart, labs and discussed the procedure including the risks, benefits and alternatives for the proposed anesthesia with the patient or authorized representative who has indicated his/her understanding and acceptance.     Plan Discussed with:   Anesthesia Plan Comments:         Anesthesia Quick Evaluation

## 2015-06-11 NOTE — Op Note (Signed)
[facility] Gastroenterology Patient Name: Sarah Mccormick Procedure Date: 06/11/2015 11:12 AM MRN: [id number] Account #: [id number] Date of Birth: 1928/03/16 Admit Type: Outpatient Age: 81 Room: [facility] ENDO ROOM 1 Gender: Female Note Status: Finalized Procedure:         Upper GI endoscopy Indications:       Heme positive stool Providers:         Manya Silvas, MD Referring MD:      Ngwe A. Aycock (Referring MD) Medicines:         Propofol per Anesthesia Complications:     No immediate complications. Procedure:         Pre-Anesthesia Assessment:                    - After reviewing the risks and benefits, the patient was                     deemed in satisfactory condition to undergo the procedure.                    After obtaining informed consent, the endoscope was passed                     under direct vision. Throughout the procedure, the                     patient's blood pressure, pulse, and oxygen saturations                     were monitored continuously. The Endoscope was introduced                     through the mouth, and advanced to the second part of                     duodenum. The upper GI endoscopy was accomplished without                     difficulty. The patient tolerated the procedure well. Findings:      The examined esophagus was normal. GEJ 38cm.      The stomach was normal.      The examined duodenum was normal. Impression:        - Normal esophagus.                    - Normal stomach.                    - Normal examined duodenum.                    - No specimens collected. Recommendation:    - Perform a colonoscopy as previously scheduled. Manya Silvas, MD 06/11/2015 11:47:53 AM This report has been signed electronically. Number of Addenda: 0 Note Initiated On: 06/11/2015 11:12 AM      [facility]

## 2015-06-11 NOTE — Anesthesia Procedure Notes (Signed)
Performed by: COOK-MARTIN, Solace Manwarren Pre-anesthesia Checklist: Patient identified, Emergency Drugs available, Suction available, Patient being monitored and Timeout performed Patient Re-evaluated:Patient Re-evaluated prior to inductionOxygen Delivery Method: Nasal cannula Preoxygenation: Pre-oxygenation with 100% oxygen Intubation Type: IV induction Airway Equipment and Method: Bite block Placement Confirmation: positive ETCO2 and CO2 detector     

## 2015-06-12 LAB — SURGICAL PATHOLOGY

## 2015-06-14 ENCOUNTER — Encounter: Payer: Self-pay | Admitting: Unknown Physician Specialty

## 2016-01-20 IMAGING — CT CT ABD-PELV W/O CM
2 of 4 series · 16 of 46 positions shown, 18 images · non-contrast
Comparison: None.

CLINICAL DATA: Vomiting and mid abdominal pain for 2 days.
Intermittent mid abdominal pain for 2 months.

EXAM:
CT ABDOMEN AND PELVIS WITHOUT CONTRAST
TECHNIQUE: Multidetector CT imaging of the abdomen and pelvis was performed
following the standard protocol without IV contrast.

[Series 2: routine abd pel without · axial · non-contrast · 0.77mm/px · z∈[-387,+18]mm · 13 of 89 slices shown, 15 images]
[im 4/89  soft-tissue]
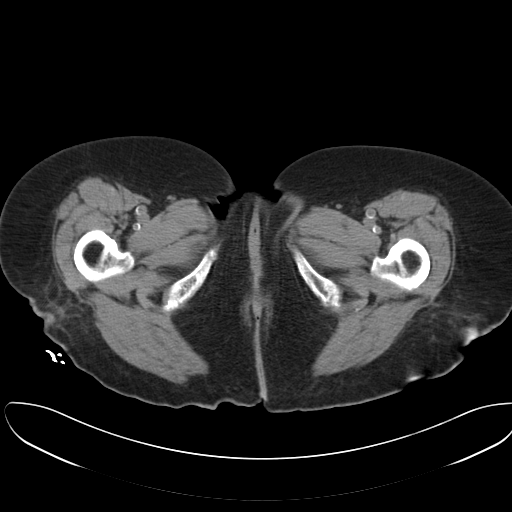
[im 4/89  bone]
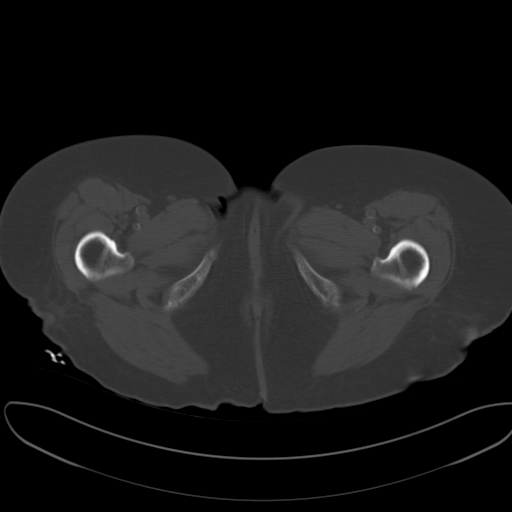
[im 11/89  soft-tissue]
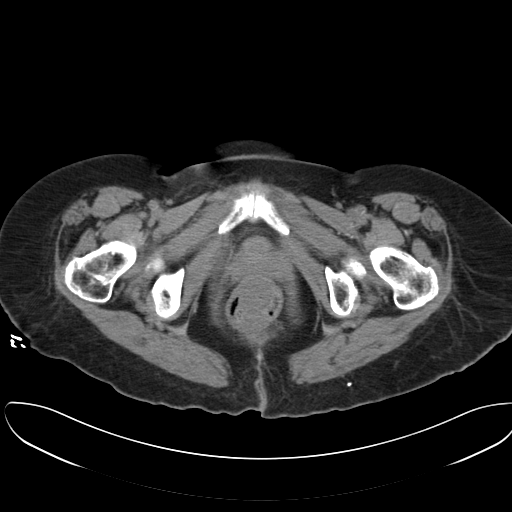
[im 18/89  soft-tissue]
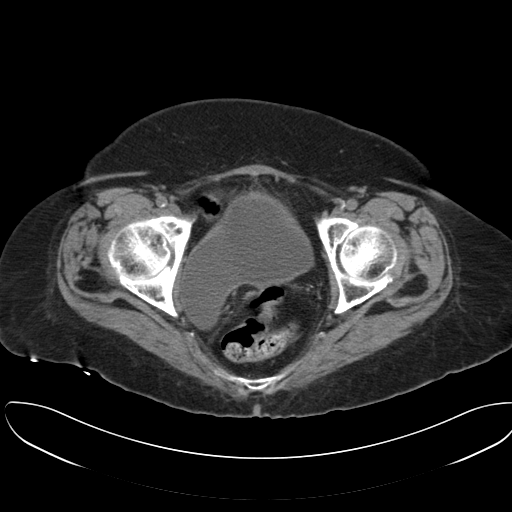
[im 25/89  soft-tissue]
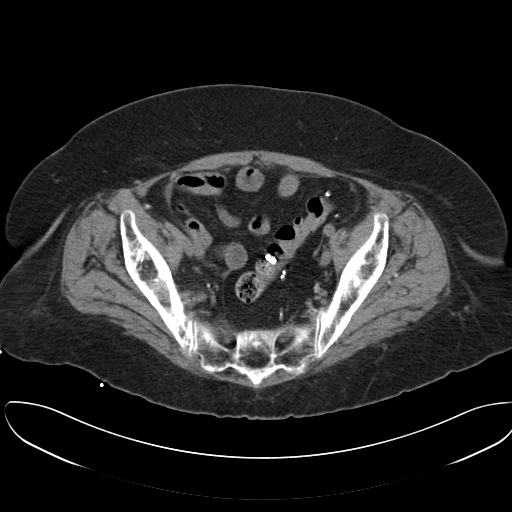
[im 32/89  soft-tissue]
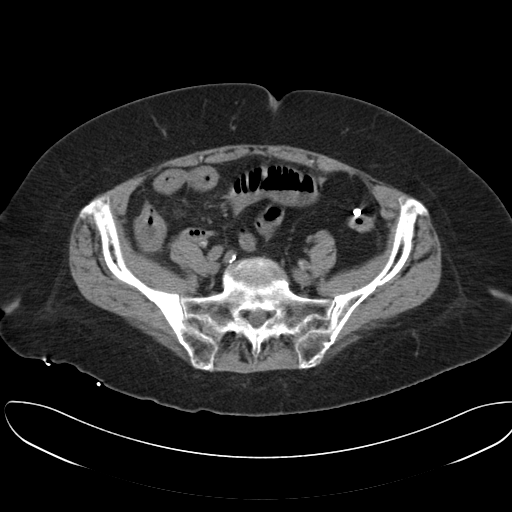
[im 39/89  soft-tissue]
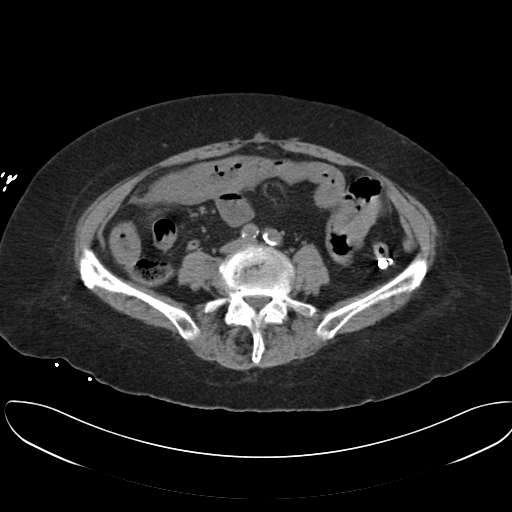
[im 46/89  soft-tissue]
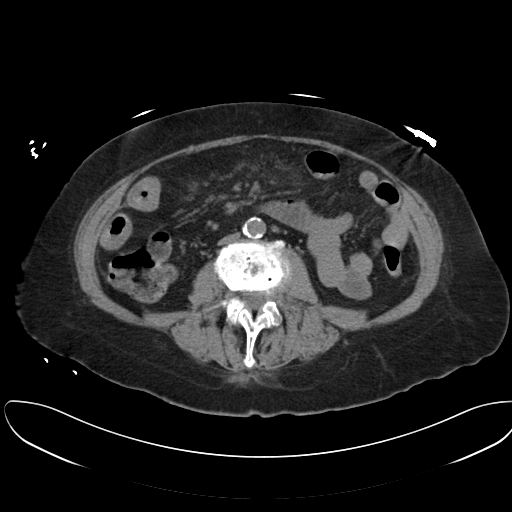
[im 50/89  soft-tissue]
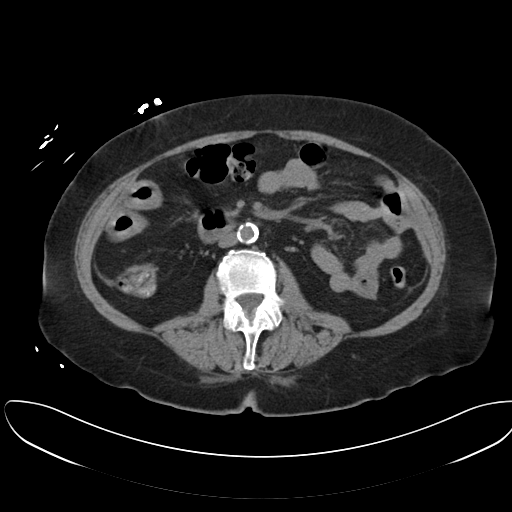
[im 57/89  soft-tissue]
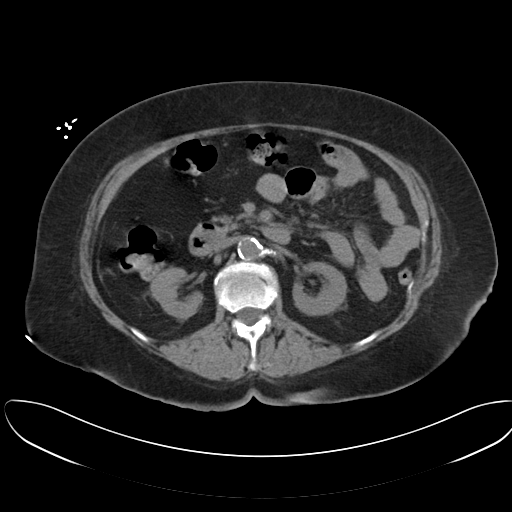
[im 57/89  bone]
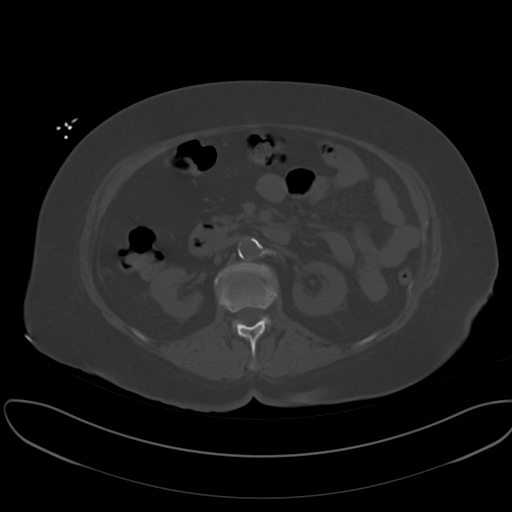
[im 64/89  soft-tissue]
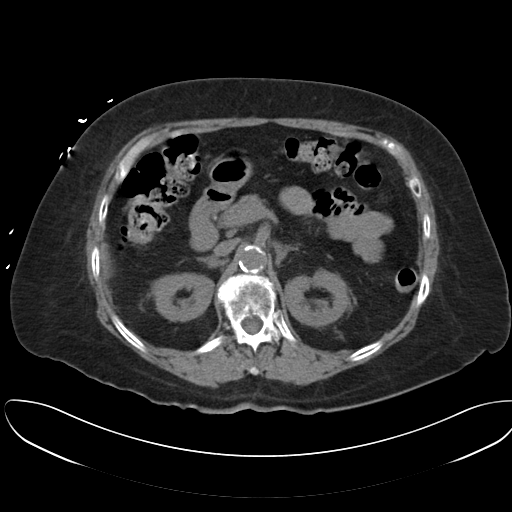
[im 71/89  soft-tissue]
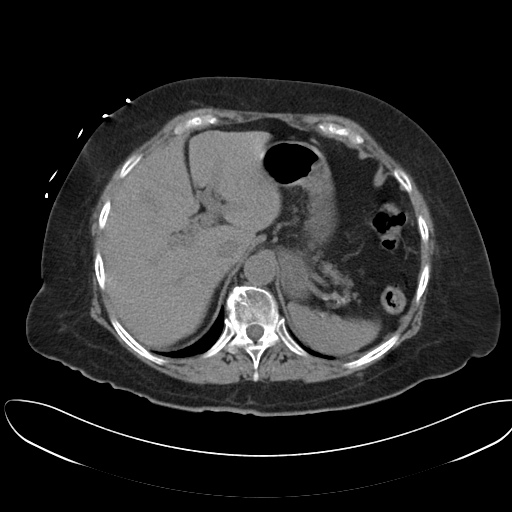
[im 78/89  soft-tissue]
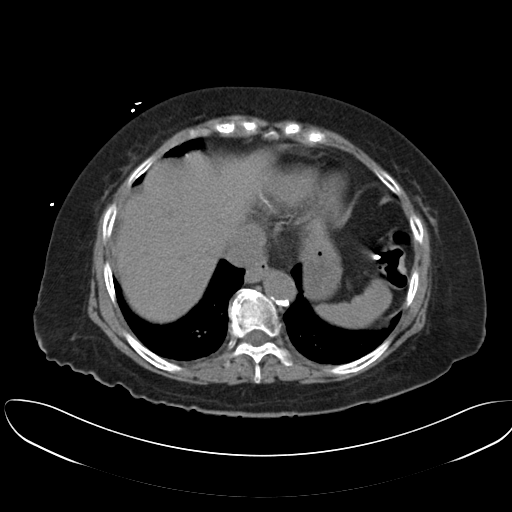
[im 85/89  soft-tissue]
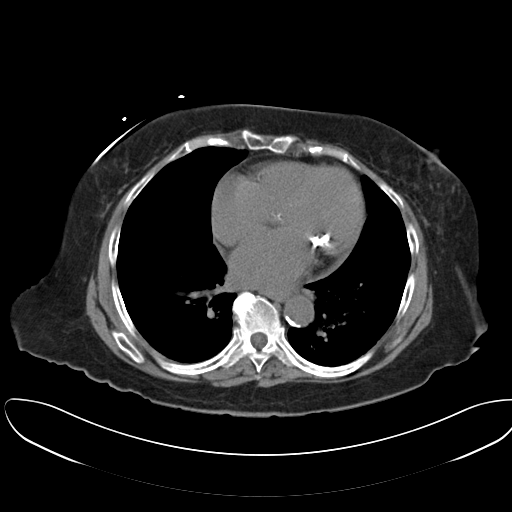

[Series 5: cor routine abd pel wo · coronal · 0.91mm/px · 3 of 122 slices shown]
[im 41/122  soft-tissue]
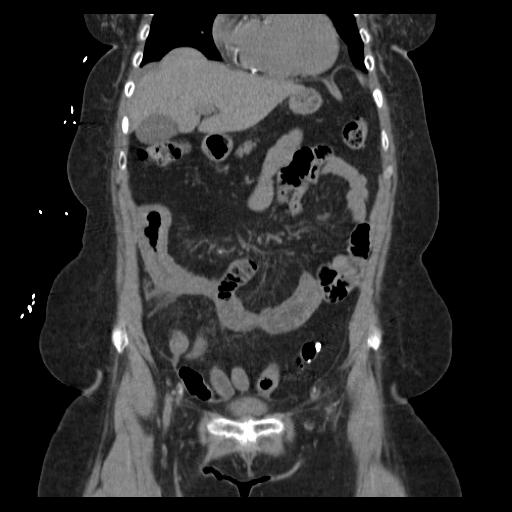
[im 54/122  soft-tissue]
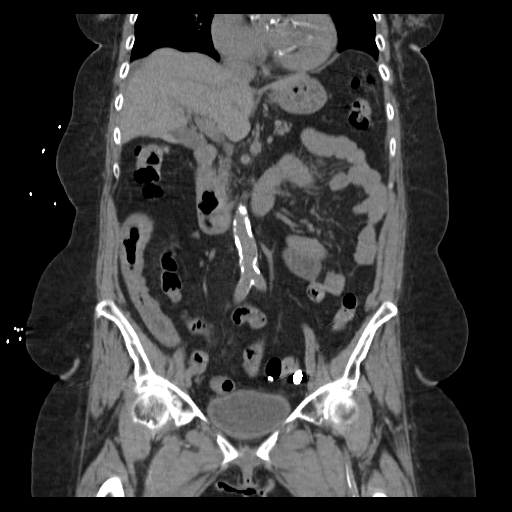
[im 68/122  soft-tissue]
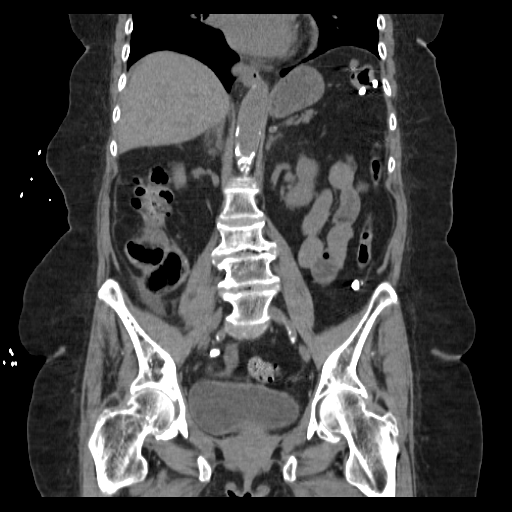

[16 of 46 positions shown; findings below may reference images not displayed]

FINDINGS: Mild bronchial thickening and atelectasis at the lung bases. Heart
appears at the upper limits of normal in size. Coronary artery
calcifications are seen.

The stomach is physiologically distended. Proximal small bowel loops
are normal. There is moderate length segment of small bowel with
diffuse bowel wall thickening, mild mesenteric edema, and
surrounding perienteric inflammatory change in the right and lower
abdomen. No pneumatosis or perforation. Small volume of colonic
stool. Multiple colonic diverticula, many of which contain barium
from prior enteric contrast exam. There is high density within the
appendiceal lumen, consistent with prior barium administration. No
periappendiceal inflammatory change. No free air, free fluid, or
intra-abdominal fluid collection.

Probable punctate hepatic granuloma, unenhanced liver is otherwise
unremarkable. The gallbladder is physiologically distended. The
unenhanced spleen, adrenal glands, and pancreas are normal. Mild
thinning of the renal parenchyma. Vascular calcification versus
nonobstructing stone in the upper left kidney. No hydronephrosis.
There is a 2.2 x 1.5 cm cyst in the mid right kidney.

Abdominal aorta is normal in caliber with dense atherosclerosis. No
retroperitoneal adenopathy.

Within the pelvis the urinary bladder is physiologically distended.
Uterus is not seen, presumably surgically absent. No adnexal mass.
No pelvic free fluid.

Right lateral pars defect versus laminectomy defect at L3. There is
multilevel degenerative change throughout the spine. The bones are
diffusely under mineralized.
IMPRESSION: 1. Moderate long segment of diffuse small bowel thickening with
surrounding inflammatory change involving small bowel loops, in the
mid lower abdomen suggesting enteritis. This may be infectious or
inflammatory. There is no obstruction. No perforation or abscess.
2. Colonic diverticulosis without diverticulitis. Extensive
atherosclerosis. Punctate nonobstructing left renal stone versus
renovascular calcification.

## 2017-09-10 DEATH — deceased
